# Patient Record
Sex: Male | Born: 1940 | Race: White | Marital: Married | Smoking: Former smoker
Health system: Southern US, Community
[De-identification: ages and names within clinical notes are randomized; demographics above are authoritative.]

---

## 2013-10-13 ENCOUNTER — Other Ambulatory Visit: Payer: Self-pay | Admitting: Neurosurgery

## 2013-10-13 DIAGNOSIS — M545 Low back pain: Secondary | ICD-10-CM

## 2013-10-25 ENCOUNTER — Ambulatory Visit
Admission: RE | Admit: 2013-10-25 | Discharge: 2013-10-25 | Disposition: A | Discharge status: Home / Self Care | Payer: Medicare Other | Source: Ambulatory Visit | Attending: Neurosurgery | Admitting: Neurosurgery

## 2013-10-25 VITALS — BP 156/74 | HR 58

## 2013-10-25 DIAGNOSIS — M545 Low back pain: Secondary | ICD-10-CM

## 2013-10-25 MED ORDER — DIAZEPAM 5 MG PO TABS
5.0000 mg | ORAL_TABLET | Freq: Once | ORAL | Status: AC
  Administered 2013-10-25: 5 mg via ORAL

## 2013-10-25 MED ORDER — IOHEXOL 180 MG/ML  SOLN
15.0000 mL | Freq: Once | INTRAMUSCULAR | Status: AC | PRN
  Administered 2013-10-25: 15 mL via INTRATHECAL

## 2013-11-14 ENCOUNTER — Other Ambulatory Visit: Payer: Self-pay | Admitting: Neurosurgery

## 2013-11-14 DIAGNOSIS — M5136 Other intervertebral disc degeneration, lumbar region: Secondary | ICD-10-CM

## 2013-11-24 ENCOUNTER — Ambulatory Visit
Admission: RE | Admit: 2013-11-24 | Discharge: 2013-11-24 | Disposition: A | Discharge status: Home / Self Care | Payer: Medicare Other | Source: Ambulatory Visit | Attending: Neurosurgery | Admitting: Neurosurgery

## 2013-11-24 DIAGNOSIS — M5136 Other intervertebral disc degeneration, lumbar region: Secondary | ICD-10-CM

## 2013-11-24 MED ORDER — IOHEXOL 180 MG/ML  SOLN
1.0000 mL | Freq: Once | INTRAMUSCULAR | Status: AC | PRN
  Administered 2013-11-24: 1 mL via EPIDURAL

## 2013-11-24 MED ORDER — METHYLPREDNISOLONE ACETATE 40 MG/ML INJ SUSP (RADIOLOG
120.0000 mg | Freq: Once | INTRAMUSCULAR | Status: AC
  Administered 2013-11-24: 120 mg via EPIDURAL

## 2013-12-09 ENCOUNTER — Other Ambulatory Visit: Payer: Self-pay | Admitting: Neurosurgery

## 2013-12-09 DIAGNOSIS — M5136 Other intervertebral disc degeneration, lumbar region: Secondary | ICD-10-CM

## 2013-12-12 ENCOUNTER — Other Ambulatory Visit: Payer: Self-pay | Admitting: Neurosurgery

## 2013-12-12 DIAGNOSIS — M5136 Other intervertebral disc degeneration, lumbar region: Secondary | ICD-10-CM

## 2013-12-20 ENCOUNTER — Ambulatory Visit
Admission: RE | Admit: 2013-12-20 | Discharge: 2013-12-20 | Disposition: A | Discharge status: Home / Self Care | Payer: Medicare Other | Source: Ambulatory Visit | Attending: Neurosurgery | Admitting: Neurosurgery

## 2013-12-20 DIAGNOSIS — M5136 Other intervertebral disc degeneration, lumbar region: Secondary | ICD-10-CM

## 2013-12-20 MED ORDER — IOHEXOL 180 MG/ML  SOLN
1.0000 mL | Freq: Once | INTRAMUSCULAR | Status: AC | PRN
  Administered 2013-12-20: 1 mL via EPIDURAL

## 2013-12-20 MED ORDER — METHYLPREDNISOLONE ACETATE 40 MG/ML INJ SUSP (RADIOLOG
120.0000 mg | Freq: Once | INTRAMUSCULAR | Status: AC
  Administered 2013-12-20: 120 mg via EPIDURAL

## 2014-01-12 ENCOUNTER — Other Ambulatory Visit: Payer: Self-pay | Admitting: Neurosurgery

## 2014-01-12 DIAGNOSIS — M5136 Other intervertebral disc degeneration, lumbar region: Secondary | ICD-10-CM

## 2014-01-18 ENCOUNTER — Ambulatory Visit
Admission: RE | Admit: 2014-01-18 | Discharge: 2014-01-18 | Disposition: A | Discharge status: Home / Self Care | Payer: Medicare Other | Source: Ambulatory Visit | Attending: Neurosurgery | Admitting: Neurosurgery

## 2014-01-18 DIAGNOSIS — M5136 Other intervertebral disc degeneration, lumbar region: Secondary | ICD-10-CM

## 2014-01-18 MED ORDER — IOHEXOL 180 MG/ML  SOLN
1.0000 mL | Freq: Once | INTRAMUSCULAR | Status: AC | PRN
  Administered 2014-01-18: 1 mL via EPIDURAL

## 2014-01-18 MED ORDER — METHYLPREDNISOLONE ACETATE 40 MG/ML INJ SUSP (RADIOLOG
120.0000 mg | Freq: Once | INTRAMUSCULAR | Status: AC
  Administered 2014-01-18: 120 mg via EPIDURAL

## 2014-10-11 IMAGING — CR DG MYELOGRAM LUMBAR
5 of 19 series · 5 of 19 positions shown · non-contrast
Comparison: 02/23/2013 MRI at [REDACTED].

CLINICAL DATA: Bilateral lower extremity pain. Scoliosis with
multilevel facet arthritis and spinal stenosis.

EXAM:
LUMBAR MYELOGRAM
TECHNIQUE: Contiguous axial images were obtained through the lumbar spine
without infusion. Coronal, sagittal, and disc space reconstructions
were obtained of the axial image sets.

[[hospital]]
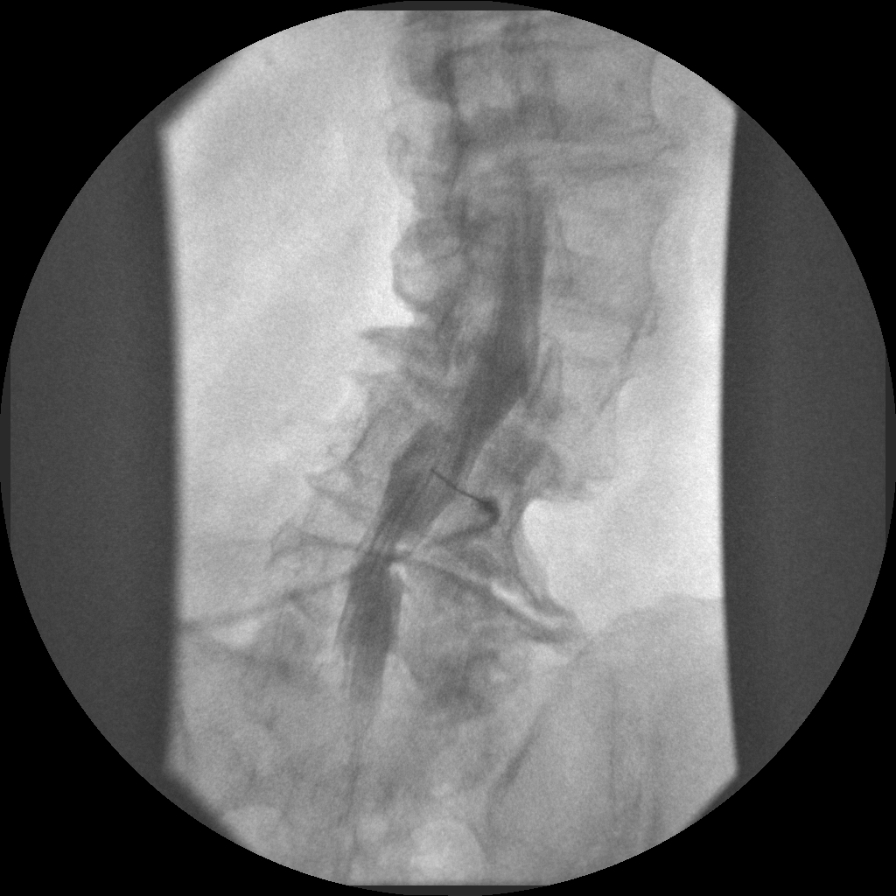

[myelogram  white (1 of 4)]
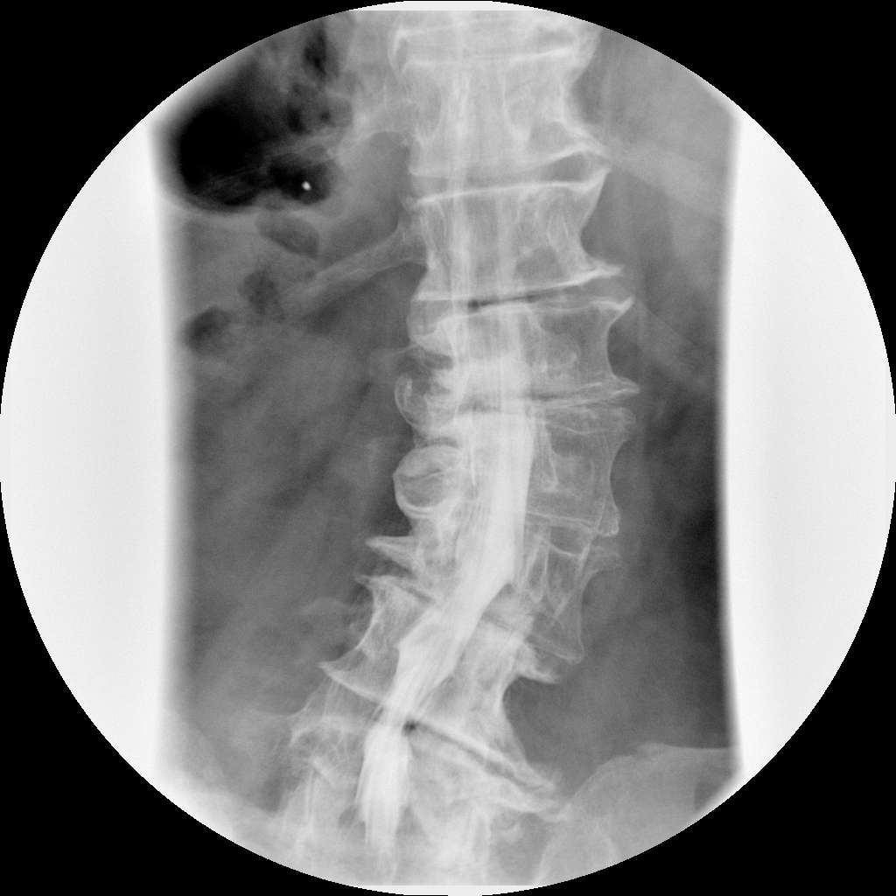

[myelogram  white (2 of 4)]
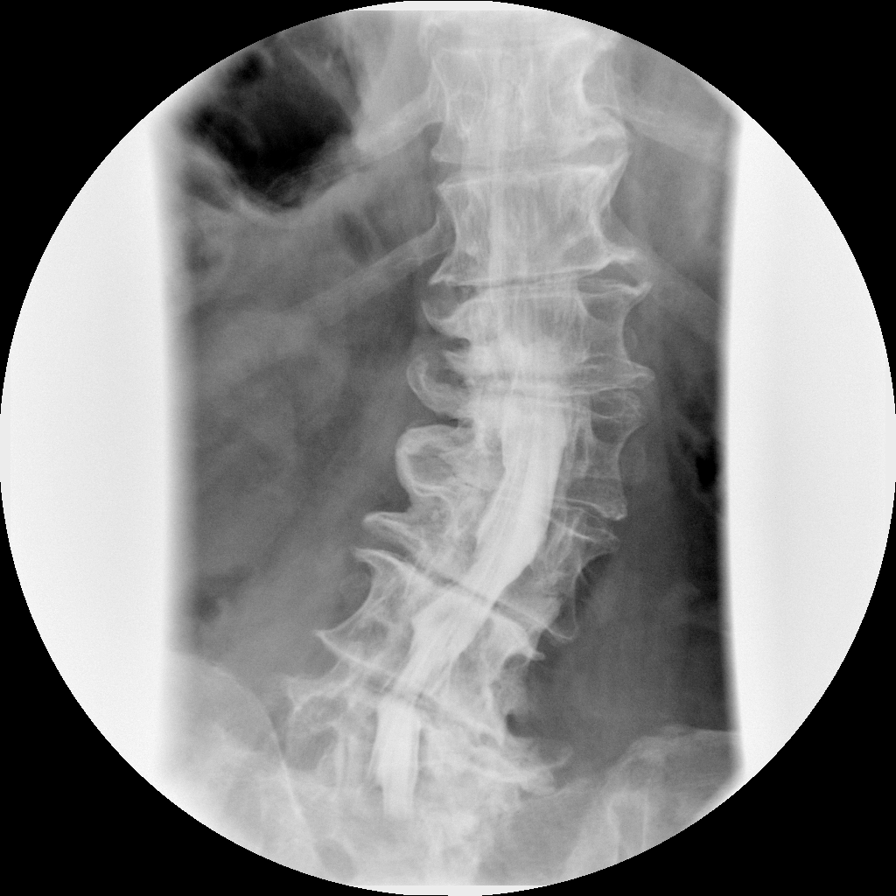

[myelogram  white (3 of 4)]
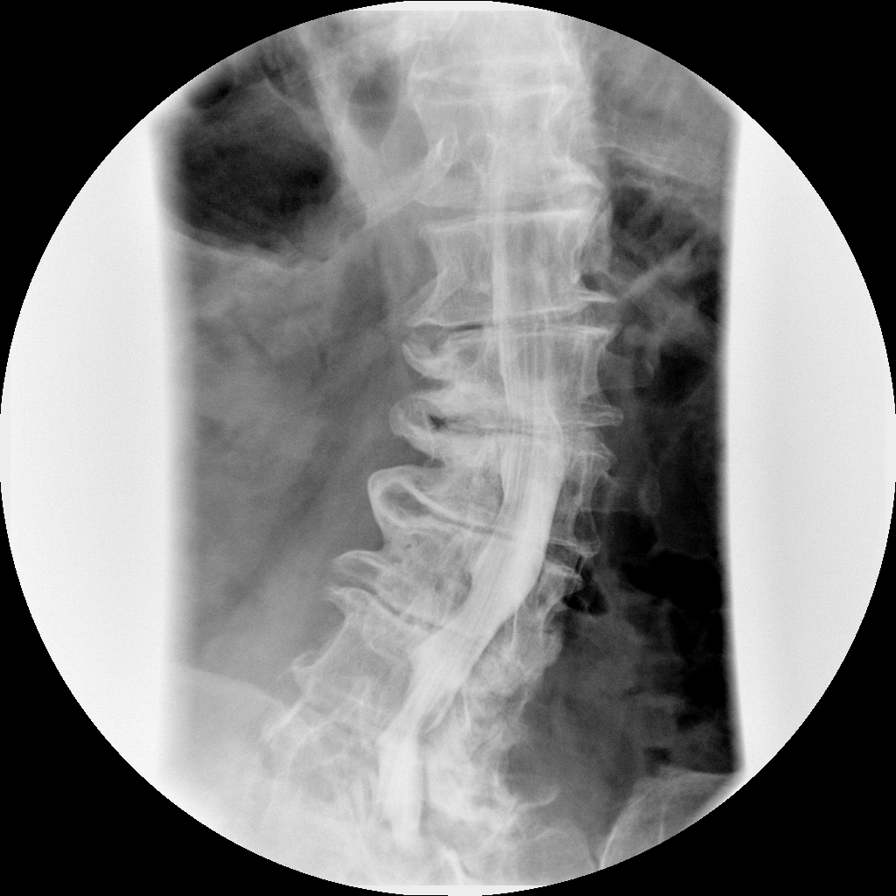

[myelogram  white (4 of 4)]
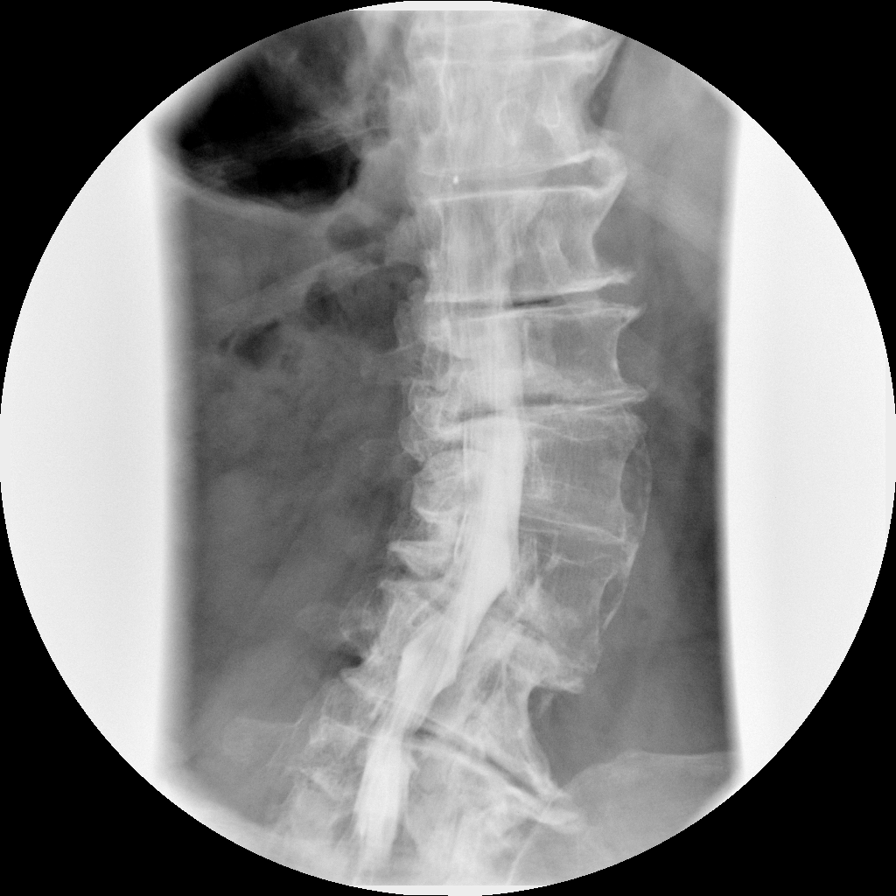

[5 of 19 positions shown; findings below may reference images not displayed]

FLUOROSCOPY TIME:  1 min 11 seconds

PROCEDURE:
After thorough discussion of risks and benefits of the procedure
including bleeding, infection, injury to nerves, blood vessels,
adjacent structures as well as headache and CSF leak, written and
oral informed consent was obtained. Consent was obtained by Dr.
Laaouina Tiger. Time out form was completed.

Patient was positioned prone on the fluoroscopy table. Local
anesthesia was provided with 1% lidocaine without epinephrine after
prepped and draped in the usual sterile fashion. Puncture was
performed at L3-L4 using a 3 1/2 inch 22 gauge pencil point Ingris Paola
needle via right paramedian approach. Using a single pass through
the dura, the needle was placed within the thecal sac, with return
of clear CSF. 15 mL of Imnipaque-A1P was injected into the thecal
sac, with normal opacification of the nerve roots and cauda equina
consistent with free flow within the subarachnoid space.

I personally performed the lumbar puncture and administered the
intrathecal contrast. I also personally supervised acquisition of
the myelogram images.
FINDINGS: LUMBAR MYELOGRAM FINDINGS:

There is a severe dextro convex lumbar scoliosis with the apex at
L1-L2. Compensatory curve to the right is present with the apex at
L5-S1. Severe multilevel degenerative disc disease and facet disease
is present. There is moderate L4-L5 central stenosis which appears
multifactorial. Bilateral lateral recess stenosis potentially
affecting the L5 nerves. Right lateral recess stenosis at L5-S1
potentially affects the right S1 nerve. Left lateral recess stenosis
at L3-L4 potentially affects the descending left L4 nerve. Anterior
extradural impressions are present from L1-L2 through L4-L5. There
is apparent anterolisthesis of L5 on S1 on the standing upright
radiographs however this is probably due to the rotoscoliosis. There
is no pathologic motion with flexion and extension maneuvers.

CT LUMBAR MYELOGRAM FINDINGS:

Incidental visualization of the paraspinal soft tissues shows a
tortuous thoracic aorta. Aortoiliac atherosclerosis. There is a left
pleural effusion which is partially visualized. Calcifications along
the left lower lobe pleural surface are noted. If this is a new
finding, consider CT scan of the chest.

Severe right-greater-than-left sacroiliac joint degenerative disease
is present. Partial ankylosis of the left SI joint appears
degenerative.

The spinal cord terminates posterior to the L1 vertebra.

T11-T12: Schmorl's nodes. Vacuum disc. Loss of disc height. Shallow
broad-based disk bulging without stenosis. Severe right facet
arthrosis with vacuum joint. Mild right foraminal stenosis.

T12-L1: Severe disk degeneration. Vacuum disc. Broad-based posterior
disk bulging. Loss of disc height. Foramina appear adequately
patent. Central canal is adequately patent. Moderate right and mild
left facet arthrosis.

L1-L2: Severe disc degeneration with vacuum disc. Sclerotic
degenerative endplate changes. Shallow broad-based posterior
osteophytes. Moderate left foraminal stenosis associated with facet
spurring and endplate spurring. Right foramen is patent. Minimal
central stenosis. Osteophyte contacts nerve roots approaching the
left lateral recess.

L2-L3: Central canal is adequately patent. Left lateral
syndesmophytes. Severe disk degeneration. Mild vacuum disc.
Degenerative endplate changes eccentric to the left. Mild left
foraminal stenosis. The right foramen is adequately patent. Mild
bilateral facet arthrosis.

L3-L4: Mild to moderate central stenosis is multifactorial. The disk
is severely degenerated with vacuum disc. Left-greater-than-right
severe facet arthrosis. The lateral recesses appear adequately
patent. Moderate to severe left foraminal stenosis. Right neural
foramen is adequately patent. Rightward slip of L3 on L4 is present
associated with a rotoscoliosis. This slip measures 14 mm.

L4-L5: Moderate to severe central stenosis. This is multifactorial.
The disk is severely degenerated and there is broad-based right
eccentric disc bulging. Right lateral recess stenosis. Mild left
lateral recess stenosis. Severe right and moderate to severe left
facet arthrosis. There is mild left foraminal stenosis with severe
right foraminal stenosis with near complete effacement of fat within
the right neural foramen. Endplate and facet spurs protrude into the
foramen.

L5-S1: Mild to moderate central stenosis. There is right lateral
recess encroachment associated with right facet arthrosis. The right
facet joint is severely degenerated. Mild left facet arthrosis.
Severe right foraminal stenosis with mild left foraminal stenosis
which is multifactorial. Severe disc degeneration with vacuum disc.
Trace anterolisthesis.
IMPRESSION: 1. Technically successful lumbar puncture for lumbar myelogram.
2. Left pleural effusion with left basilar pleural thickening and
calcification. Consider CT chest with infusion for further
assessment.
3. Severe rotoscoliosis with the apex at L1-L2, dextro convex.
4. L4-L5 moderate to severe central stenosis with right lateral
recess stenosis and compression of the descending right L5 nerve.
There is also severe right foraminal stenosis associated with
degenerative disc and facet disease.
5. L5-S1 right lateral recess stenosis potentially affecting the
right S1 nerve and severe right foraminal stenosis potentially
affecting the exiting right L5 nerve.
6. L3-L4 left foraminal stenosis associated with endplate osteophyte
and degenerative facet disease.

## 2015-09-22 DEATH — deceased
